# Patient Record
Sex: Female | Born: 1935 | Race: Black or African American | Hispanic: No
Health system: Southern US, Community
[De-identification: ages and names within clinical notes are randomized; demographics above are authoritative.]

---

## 2009-12-03 ENCOUNTER — Inpatient Hospital Stay: Payer: Self-pay | Admitting: Internal Medicine

## 2009-12-03 ENCOUNTER — Emergency Department: Payer: Self-pay | Admitting: Emergency Medicine

## 2011-01-12 ENCOUNTER — Inpatient Hospital Stay: Payer: Self-pay | Admitting: Internal Medicine

## 2012-09-15 ENCOUNTER — Emergency Department: Payer: Self-pay | Admitting: Emergency Medicine

## 2014-05-11 ENCOUNTER — Ambulatory Visit: Payer: Self-pay | Admitting: Internal Medicine

## 2014-06-04 ENCOUNTER — Inpatient Hospital Stay: Payer: Self-pay | Admitting: Internal Medicine

## 2014-06-04 LAB — URINALYSIS, COMPLETE
BACTERIA: NONE SEEN
BILIRUBIN, UR: NEGATIVE
BLOOD: NEGATIVE
Glucose,UR: NEGATIVE mg/dL (ref 0–75)
Ketone: NEGATIVE
LEUKOCYTE ESTERASE: NEGATIVE
Nitrite: NEGATIVE
Ph: 5 (ref 4.5–8.0)
Protein: NEGATIVE
RBC,UR: <1 /HPF (ref 0–5)
SQUAMOUS EPITHELIAL: NONE SEEN
Specific Gravity: 1.01 (ref 1.003–1.030)

## 2014-06-04 LAB — CBC
HCT: 30.3 % — ABNORMAL LOW (ref 40.0–52.0)
HGB: 9.4 g/dL — ABNORMAL LOW (ref 12.0–16.0)
MCH: 29.6 pg (ref 26.0–34.0)
MCHC: 31.2 g/dL — ABNORMAL LOW (ref 32.0–36.0)
MCV: 95 fL (ref 80–100)
Platelet: 155 10*3/uL (ref 150–440)
RBC: 3.19 10*6/uL — AB (ref 4.40–5.90)
RDW: 13.5 % (ref 11.5–14.5)
WBC: 7.1 10*3/uL (ref 3.8–10.6)

## 2014-06-04 LAB — COMPREHENSIVE METABOLIC PANEL
ALK PHOS: 75 U/L
ALT: 13 U/L — AB
Albumin: 3.2 g/dL — ABNORMAL LOW (ref 3.4–5.0)
Anion Gap: 8 (ref 7–16)
BUN: 33 mg/dL — ABNORMAL HIGH (ref 7–18)
Bilirubin,Total: 0.5 mg/dL (ref 0.2–1.0)
Calcium, Total: 9 mg/dL (ref 8.5–10.1)
Chloride: 113 mmol/L — ABNORMAL HIGH (ref 98–107)
Co2: 20 mmol/L — ABNORMAL LOW (ref 21–32)
Creatinine: 1.68 mg/dL — ABNORMAL HIGH (ref 0.60–1.30)
EGFR (Non-African Amer.): 25 — ABNORMAL LOW
GFR CALC AF AMER: 29 — AB
GLUCOSE: 167 mg/dL — AB (ref 65–99)
Osmolality: 292 (ref 275–301)
POTASSIUM: 6 mmol/L — AB (ref 3.5–5.1)
SGOT(AST): 29 U/L (ref 15–37)
Sodium: 141 mmol/L (ref 136–145)
TOTAL PROTEIN: 7.6 g/dL (ref 6.4–8.2)

## 2014-06-04 LAB — TROPONIN I: Troponin-I: <0.02 ng/mL

## 2014-06-04 LAB — LIPASE, BLOOD: Lipase: 430 U/L — ABNORMAL HIGH (ref 73–393)

## 2014-06-05 LAB — BASIC METABOLIC PANEL
Anion Gap: 6 — ABNORMAL LOW (ref 7–16)
BUN: 33 mg/dL — ABNORMAL HIGH (ref 7–18)
Calcium, Total: 8.7 mg/dL (ref 8.5–10.1)
Chloride: 115 mmol/L — ABNORMAL HIGH (ref 98–107)
Co2: 21 mmol/L (ref 21–32)
Creatinine: 1.7 mg/dL — ABNORMAL HIGH (ref 0.60–1.30)
GFR CALC AF AMER: 28 — AB
GFR CALC NON AF AMER: 24 — AB
Glucose: 200 mg/dL — ABNORMAL HIGH (ref 65–99)
OSMOLALITY: 296 (ref 275–301)
POTASSIUM: 6.3 mmol/L — AB (ref 3.5–5.1)
Sodium: 142 mmol/L (ref 136–145)

## 2014-06-05 LAB — CBC WITH DIFFERENTIAL/PLATELET
BASOS PCT: 0.1 %
Basophil #: 0 10*3/uL (ref 0.0–0.1)
EOS ABS: 0 10*3/uL (ref 0.0–0.7)
EOS PCT: 0 %
HCT: 31.1 % — AB (ref 40.0–52.0)
HGB: 9.6 g/dL — ABNORMAL LOW (ref 12.0–16.0)
LYMPHS ABS: 0.8 10*3/uL — AB (ref 1.0–3.6)
LYMPHS PCT: 6.8 %
MCH: 29.1 pg (ref 26.0–34.0)
MCHC: 31 g/dL — ABNORMAL LOW (ref 32.0–36.0)
MCV: 94 fL (ref 80–100)
MONO ABS: 0.5 10*3/uL (ref 0.2–1.0)
Monocyte %: 3.7 %
NEUTROS PCT: 89.4 %
Neutrophil #: 10.9 10*3/uL — ABNORMAL HIGH (ref 1.4–6.5)
PLATELETS: 125 10*3/uL — AB (ref 150–440)
RBC: 3.31 10*6/uL — ABNORMAL LOW (ref 4.40–5.90)
RDW: 13.1 % (ref 11.5–14.5)
WBC: 12.2 10*3/uL — ABNORMAL HIGH (ref 3.8–10.6)

## 2014-06-05 LAB — POTASSIUM
Potassium: 5.4 mmol/L — ABNORMAL HIGH (ref 3.5–5.1)
Potassium: 5.2 mmol/L — ABNORMAL HIGH (ref 3.5–5.1)

## 2014-06-07 ENCOUNTER — Inpatient Hospital Stay: Payer: Self-pay | Admitting: Internal Medicine

## 2014-06-07 LAB — COMPREHENSIVE METABOLIC PANEL
ALBUMIN: 2.8 g/dL — AB (ref 3.4–5.0)
ANION GAP: 7 (ref 7–16)
Alkaline Phosphatase: 65 U/L
BUN: 34 mg/dL — AB (ref 7–18)
Bilirubin,Total: 0.8 mg/dL (ref 0.2–1.0)
CALCIUM: 8.6 mg/dL (ref 8.5–10.1)
Chloride: 109 mmol/L — ABNORMAL HIGH (ref 98–107)
Co2: 26 mmol/L (ref 21–32)
Creatinine: 1.9 mg/dL — ABNORMAL HIGH (ref 0.60–1.30)
EGFR (African American): 25 — ABNORMAL LOW
GFR CALC NON AF AMER: 21 — AB
Glucose: 110 mg/dL — ABNORMAL HIGH (ref 65–99)
OSMOLALITY: 291 (ref 275–301)
POTASSIUM: 4.1 mmol/L (ref 3.5–5.1)
SGOT(AST): 19 U/L (ref 15–37)
SGPT (ALT): 9 U/L — ABNORMAL LOW
Sodium: 142 mmol/L (ref 136–145)
Total Protein: 7 g/dL (ref 6.4–8.2)

## 2014-06-07 LAB — CBC
HCT: 28.6 % — AB (ref 40.0–52.0)
HGB: 8.9 g/dL — ABNORMAL LOW (ref 12.0–16.0)
MCH: 29.1 pg (ref 26.0–34.0)
MCHC: 31.2 g/dL — ABNORMAL LOW (ref 32.0–36.0)
MCV: 93 fL (ref 80–100)
Platelet: 114 10*3/uL — ABNORMAL LOW (ref 150–440)
RBC: 3.06 10*6/uL — AB (ref 4.40–5.90)
RDW: 13 % (ref 11.5–14.5)
WBC: 13.4 10*3/uL — AB (ref 3.8–10.6)

## 2014-06-07 LAB — URINALYSIS, COMPLETE
BILIRUBIN, UR: NEGATIVE
BLOOD: NEGATIVE
Bacteria: NONE SEEN
Glucose,UR: NEGATIVE mg/dL (ref 0–75)
Hyaline Cast: 2
KETONE: NEGATIVE
Nitrite: NEGATIVE
PH: 5 (ref 4.5–8.0)
PROTEIN: NEGATIVE
RBC,UR: 1 /HPF (ref 0–5)
Specific Gravity: 1.011 (ref 1.003–1.030)
Squamous Epithelial: <1
WBC UR: 9 /HPF (ref 0–5)

## 2014-06-07 LAB — TROPONIN I: TROPONIN-I: 0.04 ng/mL

## 2014-06-07 LAB — LIPASE, BLOOD: Lipase: 128 U/L (ref 73–393)

## 2014-06-09 LAB — BASIC METABOLIC PANEL
ANION GAP: 8 (ref 7–16)
BUN: 34 mg/dL — AB (ref 7–18)
CALCIUM: 7.9 mg/dL — AB (ref 8.5–10.1)
Chloride: 114 mmol/L — ABNORMAL HIGH (ref 98–107)
Co2: 21 mmol/L (ref 21–32)
Creatinine: 1.36 mg/dL — ABNORMAL HIGH (ref 0.60–1.30)
EGFR (Non-African Amer.): 32 — ABNORMAL LOW
GFR CALC AF AMER: 37 — AB
Glucose: 86 mg/dL (ref 65–99)
OSMOLALITY: 292 (ref 275–301)
POTASSIUM: 4.7 mmol/L (ref 3.5–5.1)
SODIUM: 143 mmol/L (ref 136–145)

## 2014-06-11 ENCOUNTER — Ambulatory Visit: Payer: Self-pay | Admitting: Internal Medicine

## 2014-07-12 DEATH — deceased

## 2015-03-04 NOTE — H&P (Signed)
PATIENT NAME:  Shelly Warner, Shelly Warner MR#:  [id number] DATE OF BIRTH:  [birth year]  DATE OF ADMISSION:  06/07/2014  PRIMARY CARE PHYSICIAN: Dr. Enid Baasadhika Rosmary Dionisio, the patient's primary MD. The doctor is making house call.   CHIEF COMPLAINT: Abdominal pain.   HISTORY OF PRESENT ILLNESS: Shelly Warner is a 81 year old African American female with past medical history significant for dementia, hypertension, hyperlipidemia who presents from home secondary to abdominal pain. The patient was just discharged 2 days ago for abdominal pain and was noted to have constipation and hypokalemia at the time. Her constipation  relieved and the patient was discharged home with home health. According to family the patient was doing fine all day yesterday. This morning, 3:00 a.m. she woke up with severe abdominal pain not associated with any nausea or vomiting. She was brought here. The patient is not a great historian due to her dementia. CT of the abdomen and pelvis was done in  the ER showing small bowel dilatation concerning for possible partial distal small bowel obstruction and stool is noted in colon and rectum.   PAST MEDICAL HISTORY:  1. Hypertension.  2. Alzheimer dementia.  3. Hyperlipidemia.   ALLERGIES: To medications: None.   PAST SURGICAL HISTORY: None.   HOME MEDICATIONS:  1. Vitamin D 3000 international units 1 tablet p.o. daily.  2. Simvastatin 20 mg p.o. daily.  3. Norvasc 10 mg p.o. daily.   SOCIAL HISTORY: Lives at home. Daughter lives with her. Patient is bed bound and hard of hearing and legally blind due to macular degeneration. No smoking, alcohol or drug abuse.    FAMILY HISTORY: Significant for hypertension.   REVIEW OF SYSTEMS: Difficult to be obtained secondary to the patient's dementia.   PHYSICAL EXAMINATION: VITAL SIGNS:  Temperature 98.5 degrees Fahrenheit, pulse 67, respirations 20, blood pressure 143/59, pulse oximetry 97% on room air.  GENERAL: Elderly female well-built,  ill-nourished, lying in bed, not in any acute distress. HEENT: Normocephalic and atraumatic. Pupils equal, round, reacting to light. Extraocular movements intact. Oropharynx: Poor dentition and poor hygiene. but no erythema, mass or exudates.  NECK: Supple. No thyromegaly, JVD, or carotid bruits. The patient has significantly deep supraclavicular fossae  indicating malnutrition.  LUNGS: Moving air bilaterally. Decreased bibasilar breath sounds. No wheeze or crackles. No use of accessory muscles for breathing.  CARDIOVASCULAR: S1, S2, regular rate and rhythm. A 4/6 loud systolic murmur present. No rubs or gallops.  ABDOMEN: Soft, nontender, hypoactive bowel sounds present, nondistended.  EXTREMITIES: No pedal edema, atrophic extremities. No clubbing or cyanosis. There are 2+ dorsalis pedis pulses palpable bilaterally.  SKIN: No acne, rash or lesions.  LYMPHATICS: No cervical lymphadenopathy.  NEUROLOGIC: The patient is able to move around in bed. Weakness in bilateral lower extremities. No new focal motor or sensory deficits.  PSYCHOLOGICAL: The patient is alert and oriented to self.    LABORATORY DATA: WBC 13.4, hemoglobin 8.9, hematocrit 28.6 , platelet count 114,000. Sodium 142 , potassium 4.1, chloride 109, bicarbonate 26, BUN 34, creatinine 1.9, glucose 110 and calcium of 8.6. ALT 9, AST 19, alkaline phosphatase 65. Total bilirubin 0.8 and albumin of 3.8.    CT of the abdomen and pelvis showing mild dilated bilateral pleural effusions, subsegmental atelectasis. Nonobstructive and small bowel dilatation  indicating small bowel obstruction. Stool was noted in colon and rectum.  Troponin is negative at 0.04, lipase is 128.   EKG showing sinus rhythm with PVCs, heart rate of 70. Incomplete right bundle branch block. No acute ST-Warner  wave abnormalities.   ASSESSMENT AND PLAN:  1. A 81 year old female with dementia, hypertension, hyperlipidemia, recently discharged after constipation treatment, comes  back with abdominal pain, partial small bowel obstruction, constipation, and NG tube.  According to family, the patient will pull it out, so hold off on NG tube and keep her n.p.o. except medications, IV fluids. Repeat KUB in the a.m. Treatment of constipation recommended.  2. Acute on chronic renal failure, worsening creatinine, poor p.o. intake, decreased appetite. Continue IV fluids and recheck in a.m.  3. Hypertension, on Norvasc.  4. Hyperlipidemia, on statin.  5. Palliative care consult for goals of  treatment and care as well as failure to thrive. The patient does not have a power of attorney, multiple children. No code status addressing in the past.  6. Physical therapy and family interested in short-term rehabilitation. Social worker consult.   CODE STATUS: Full code for now.   TIME SPENT ON ADMISSION: 50 minutes.     ____________________________ Enid Baas, MD rk:jh D: 06/07/2014 15:06:06 ET Warner: 06/07/2014 16:51:11 ET JOB#: [id number]  cc: Enid Baas, MD, <Dictator> Enid Baas MD ELECTRONICALLY SIGNED 06/08/2014 15:14

## 2015-03-04 NOTE — Discharge Summary (Signed)
PATIENT NAME:  Shelly Warner, Shelly Warner MR#:  [id number] DATE OF BIRTH:  08/25/1912  DATE OF ADMISSION:  06/07/2014 DATE OF DISCHARGE:  06/10/2014  PRIMARY CARE PHYSICIAN: [facility]  FINAL DIAGNOSES: 1.  Partial bowel obstruction.  2.  Acute renal failure.  3.  Hypertension.  4.  Hyperlipidemia.  5.  Constipation.   DISCHARGE MEDICATIONS: Include simvastatin 20 mg daily, vitamin D 1000 international units daily, amlodipine 10 mg daily, Ensure clear 200 mL 3 times a day with meals, senna 1 tablet twice a day as needed for constipation, Colace 100 mg 1 capsule twice a day as needed for constipation.   DISCHARGE DIET: Regular, mechanical soft.   DISCHARGE ACTIVITY: As tolerated.   DISCHARGE FOLLOWUP: Follow up with hospice at the rehab facility. Follow up in 1 to 2 days with doctor at rehab.   REASON FOR ADMISSION: The patient was admitted June 07, 2014 and discharged June 10, 2014. The patient came in with abdominal pain, was found to have a partial small bowel obstruction. The patient was initially n.p.o. Anti-constipation medications were given.   DIAGNOSTIC DATA: During the hospital course included an EKG that showed sinus rhythm with premature supraventricular complexes.   Troponin negative. Lipase 128. White blood cell count 13.4, H and H 8.9 and 28.6, platelet count 114,000. Glucose 110, BUN 34, creatinine 1.9, sodium 142, potassium 4.1, chloride 109, CO2 26, calcium 8.6. Liver function tests normal range.   CT scan of the abdomen and pelvis showed bilateral nonobstructive nephrolithiasis. Again noted small bowel dilatation concerning for distal small bowel or partial obstruction. Stool noted in the colon and rectum.   Urinalysis: Trace leukocyte esterase and bacteria none.   Repeat KUB showed decreased small bowel distention since previous exam. Contrast with the CT scan located now in the colon.   Creatinine upon discharge 1.36.   HOSPITAL COURSE PER PROBLEM LIST:  1.   For the patient's partial small bowel obstruction, initially kept n.p.o. Constipation medications were given. The patient had bowel movements. Diet was slowly advanced from clear liquid to full liquid to solid food. The patient tolerating small amounts of food. No vomiting. No abdominal pain. Stable for discharge to rehab.  2.  Acute renal failure. This had improved with IV fluid hydration.  3.  Hypertension. Blood pressure borderline on Norvasc.  4.  Hyperlipidemia. On Zocor.  5.  Constipation. On senna and Colace.   DISPOSITION: The patient will be discharged to rehab facility under hospice care. The patient is a DO NOT RESUSCITATE.  TIME SPENT ON DISCHARGE: 35 minutes.   ____________________________ Herschell Dimesichard J. Renae GlossWieting, MD rjw:sb D: 06/10/2014 08:24:47 ET Warner: 06/10/2014 08:38:41 ET JOB#: [id number]  cc: Herschell Dimesichard J. Renae GlossWieting, MD, <Dictator> [facility] the patient is going to  Salley ScarletICHARD J Gae Bihl MD ELECTRONICALLY SIGNED 06/10/2014 14:24

## 2015-03-04 NOTE — Discharge Summary (Signed)
PATIENT NAME:  Shelly Warner, Rolla T MR#:  [id number] DATE OF BIRTH:  1912/02/19  DATE OF ADMISSION:  06/04/2014 DATE OF DISCHARGE:  06/05/2014  ADMISSION AND DISCHARGE DIAGNOSIS:  1.  Hyperkalemia.  2.  Kidney disease.  3.  CONSTIPATION 4.  Hyperkalemia. 5.  Likely chronic kidney disease.  6.  Hypertension.  7.  Constipation.   LABORATORY DATA AT DISCHARGE: Creatinine is 1.70, potassium 5.4.   HOSPITAL COURSE: A 81 year old female who presented on July 25 with abdominal pain and generalized weakness, found to have hyperkalemia and elevated creatinine. For further details please refer to the H and P.  1.  Hyperkalemia. The patient was noted to have elevated potassium and without any EKG changes. She was treated for hyperkalemia.  Her potassium has improved. I suspect this is related to her lisinopril with some kind of chronic kidney injury so her lisinopril obviously has been discontinued.  2.  Acute kidney injury.  Likely the patient as actually has chronic kidney disease stage III.  Her creatinine has remained pretty much stable.  I did discuss with the family to avoid any nephrotoxic agents and we are holding lisinopril.  3.  Constipation on KUB. 4.  Patient will be continue with p.r.n. oxygen.  5.  Dementia. The patient seems to be at her baseline, as per family.  6.  Hypertension. Her lisinopril was changed to Norvasc.   DISCHARGE MEDICATIONS:  1.  Simvastatin 20 mg daily.  2.  Vitamin D 3000 international units daily.  3.  Norvasc 10 mg daily.   Discharge with home health, physical therapy, nurse.   DISCHARGE DIET: Regular diet.   DISCHARGE with home Health DISCHARGE FOLLOWUP: The patient will follow up with doctor making house-calls in 1 week.   TIME SPENT: 35 minutes. The patient is stable for discharge.    ____________________________ Royetta Probus P. Juliene PinaMody, MD spm:lt D: 06/05/2014 12:03:59 ET T: 06/05/2014 15:39:25 ET JOB#: [id number]  cc: Dhiya Smits P. Juliene PinaMody, MD, <Dictator> Trenise Turay P  Jakyrah Holladay MD ELECTRONICALLY SIGNED 06/06/2014 13:00

## 2015-03-04 NOTE — H&P (Signed)
Shelly Warner [name] Warner, Shelly Warner Warner MR#:  [id number] DATE OF BIRTH:  [birth year]  DATE OF ADMISSION:  06/04/2014  PRIMARY CARE PHYSICIAN: Dr. Ignacia Felling.   REFERRING PHYSICIAN: Dr. Mindi Junker    CHIEF COMPLAINT: Abdominal pain, generalized weakness.   HISTORY OF PRESENT ILLNESS: Shelly Warner Shelly Warner Warner is a 81 year old female with history of hypertension, hyperlipidemia, diabetes mellitus, diet controlled, who comes to Shelly Warner Shelly Warner Warner with complaints of abdominal pain. Per family, Shelly Warner Shelly Warner Warner has been having abdominal pain since this morning with episodes of vomiting. Concerning this, Shelly Warner Shelly Warner Warner is brought to Shelly Warner Shelly Warner Warner. Work-up in Shelly Warner Shelly Warner Warner, Shelly Warner Shelly Warner Warner is found to have elevated BUN and creatinine of 33 and 1.68, with potassium of 6. Shelly Warner Shelly Warner Warner had a KUB done which showed nonobstructive bowel gas pattern, large stool burden. Shelly Warner Shelly Warner Warner is usually severely constipated. Last bowel movement was this morning. Shelly Warner Shelly Warner Warner was also found to have elevated blood Shelly Warner Warner, systolic in Shelly Warner 190s. Shelly Warner Shelly Warner Warner received one dose of hydralazine with improvement of Shelly Warner Shelly Warner Warner to Shelly Warner 170s.   PAST MEDICAL HISTORY: 1.  Hypertension.  2.  Hyperlipidemia.    ALLERGIES: NUTS.   HOME MEDICATIONS: 1.  Vitamin D3 1 capsule once a day.  2.  Simvastatin 20 mg once a day. 3.  Lisinopril 2.5 mg once a day.  4.  Ferrous sulfate 325 mg once a day.    SOCIAL HISTORY: No history of smoking, drinking alcohol or using illicit drugs. Lives at home. One of her Shelly Warner Warner stays with her. Shelly Warner Shelly Warner Warner, hard of hearing, legally blind.   PAST SURGICAL HISTORY: None.   FAMILY HISTORY: Hypertension.   REVIEW OF SYSTEMS: Shelly Warner Shelly Warner Warner is very hard of hearing. Difficult to obtain review of systems from Shelly Warner Shelly Warner Warner. History is mainly obtained from Shelly Warner Shelly Warner Warner. However, Shelly Warner Shelly Warner Warner currently denies any abdominal pain, nausea or chest pain.   PHYSICAL EXAMINATION: GENERAL: This  is a thin-built, cachectic-looking female, lying down in Shelly Warner Shelly Warner Warner, not in distress.  VITAL SIGNS: Temperature 97.8, pulse 58, blood Shelly Warner Warner 176/60, respiratory rate of 18, oxygen saturation is 96% on room air.  HEENT: Head normocephalic, atraumatic. There is no scleral icterus. Conjunctivae normal. Pupils equal and reactive to light. Mucous membranes dry.  NECK: Supple. No lymphadenopathy. No JVD. No carotid bruit.  CHEST: Has no focal tenderness.  LUNGS: Bilaterally clear to auscultation.  HEART: S1, S2 regular. No murmurs are heard.  ABDOMEN: Bowel sounds present. Soft, nontender, nondistended. Could not appreciate hepatosplenomegaly.  EXTREMITIES: No pedal edema. Pulses 2+.  SKIN: No rash or lesions.  MUSCULOSKELETAL: Could not examine.  NEUROLOGIC: Shelly Warner Shelly Warner Warner is alert, oriented to self and family members, not to place and time. No apparent cranial nerve abnormalities. Could not examine Shelly Warner motor and sensory, as Shelly Warner Shelly Warner Warner is very hard of hearing to follow commands.   LABORATORY DATA: Complete metabolic panel: BUN 33, creatinine of 1.68, potassium of 6, bicarbonate 20. Shelly Warner rest of all Shelly Warner values are within normal limits. KUB: Nonobstructive bowel gas pattern, constipation, large stool burden. Urinalysis for nitrites and leukocyte esterase. Troponin less than 0.2. Lipase 430.   ASSESSMENT AND PLAN: Shelly Warner Shelly Warner Warner is a 81 year old female who comes to Shelly Warner Shelly Warner Warner with complaints of abdominal pain.  1.  Abdominal pain. Shelly Warner Shelly Warner Warner has mildly elevated lipase, mild pancreatitis. There also is a concern from Shelly Warner constipation. Keep Shelly Warner Shelly Warner Warner on clear liquid diet. Give laxatives. Continue with intravenous fluids and follow up.  2.  Acute renal failure. Continue  with intravenous fluids and follow up. Hold lisinopril.  3.  Hyperkalemia. Could be from Shelly Warner renal failure as well as from Shelly Warner lisinopril. Hold Shelly Warner lisinopril. Shelly Warner Shelly Warner Warner is given one dose of Kayexalate. 4.  Dehydration. Continue  with intravenous fluids.  5.  Debility. Shelly Warner Shelly Warner Warner has been Shelly Warner Warner for a long time.  6.  Keep Shelly Warner Shelly Warner Warner on deep vein thrombosis prophylaxis with heparin.   TIME SPENT: 55 minutes.   ____________________________ Susa GriffinsPadmaja Brendia Dampier, MD pv:cg D: 06/04/2014 22:56:07 ET T: 06/04/2014 23:42:09 ET JOB#: [id number]  cc: Susa GriffinsPadmaja Cortina Vultaggio, MD, <Dictator> Susa GriffinsPADMAJA Shannen Flansburg MD ELECTRONICALLY SIGNED 06/16/2014 21:04

## 2015-07-21 IMAGING — CT CT ABD-PELV W/O CM
2 of 4 series · 17 of 46 positions shown, 19 images · non-contrast
Comparison: CT scan of December 06, 2009.

CLINICAL DATA: Abdominal pain.

EXAM:
CT ABDOMEN AND PELVIS WITHOUT CONTRAST
TECHNIQUE: Multidetector CT imaging of the abdomen and pelvis was performed
following the standard protocol without IV contrast.

[Series 4: routine abd pel without_ · axial · 0.62mm/px · z∈[-864,-500]mm · 14 of 81 slices shown, 16 images]
[im 4/81  soft-tissue]
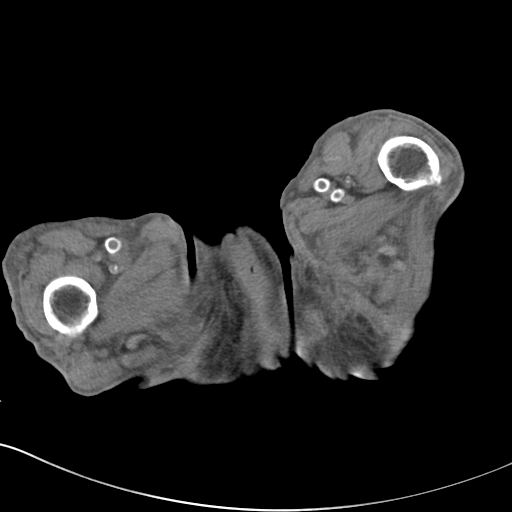
[im 4/81  bone]
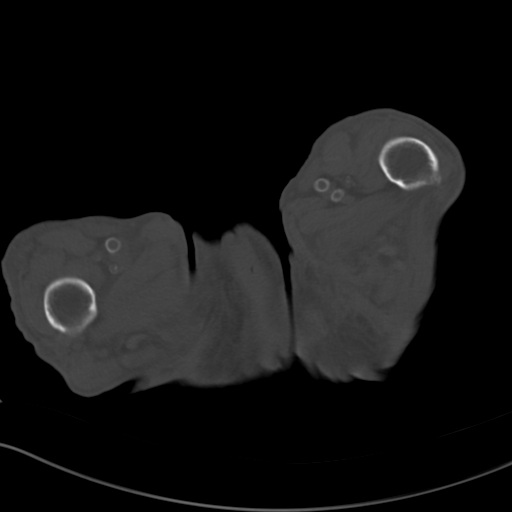
[im 10/81  soft-tissue]
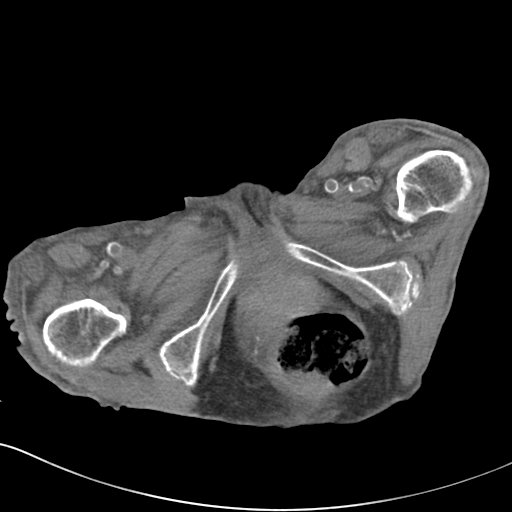
[im 17/81  soft-tissue]
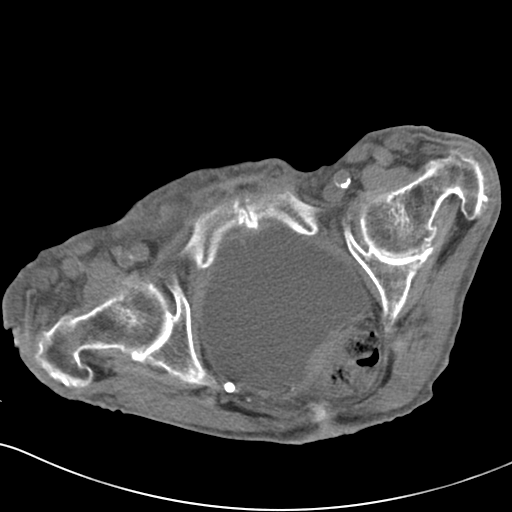
[im 23/81  soft-tissue]
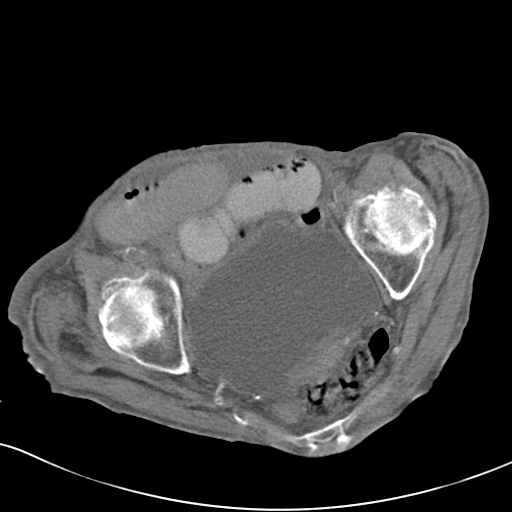
[im 26/81  soft-tissue]
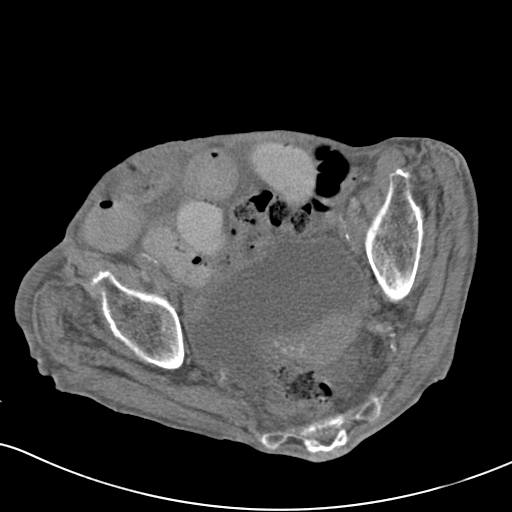
[im 33/81  soft-tissue]
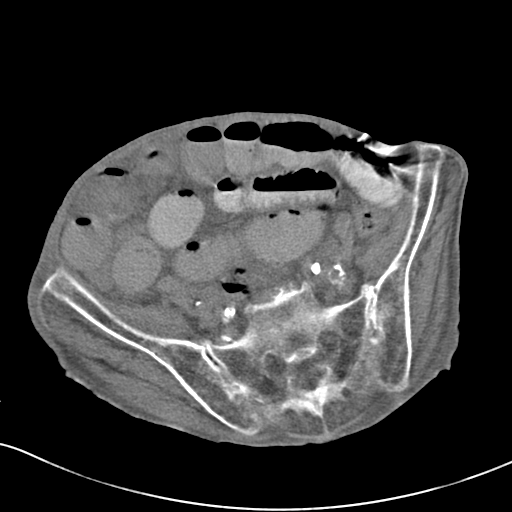
[im 39/81  soft-tissue]
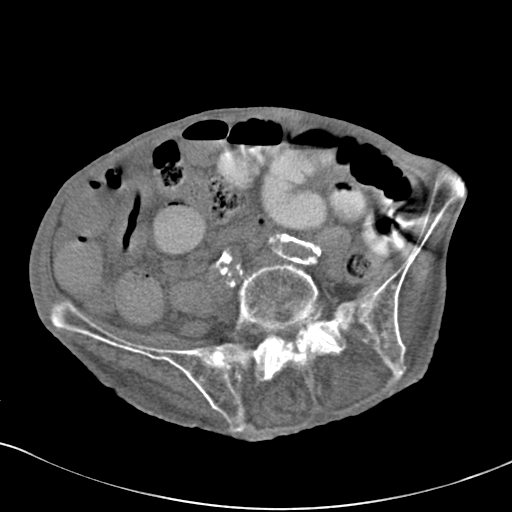
[im 42/81  soft-tissue]
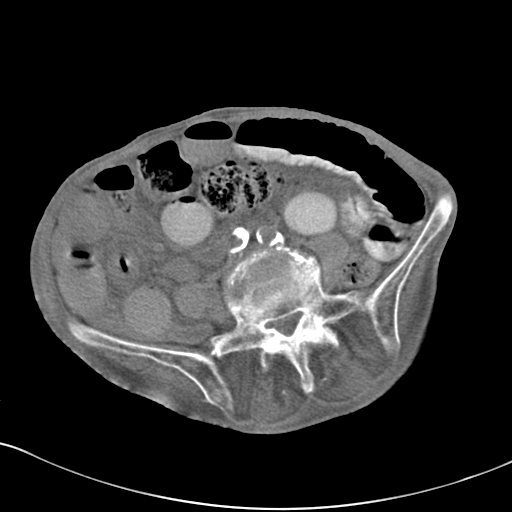
[im 49/81  soft-tissue]
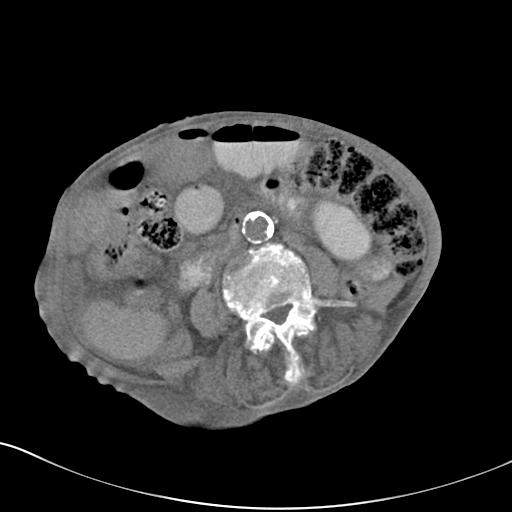
[im 49/81  bone]
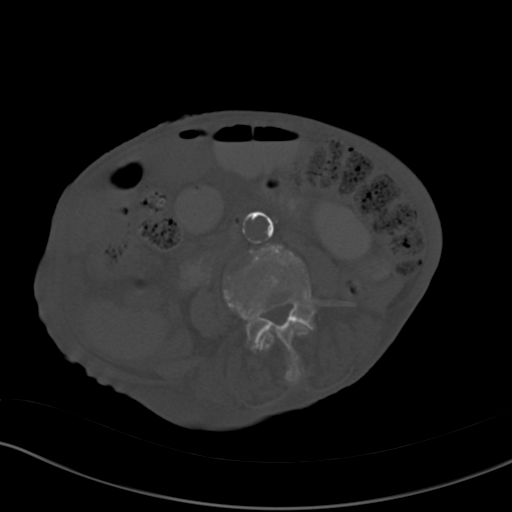
[im 55/81  soft-tissue]
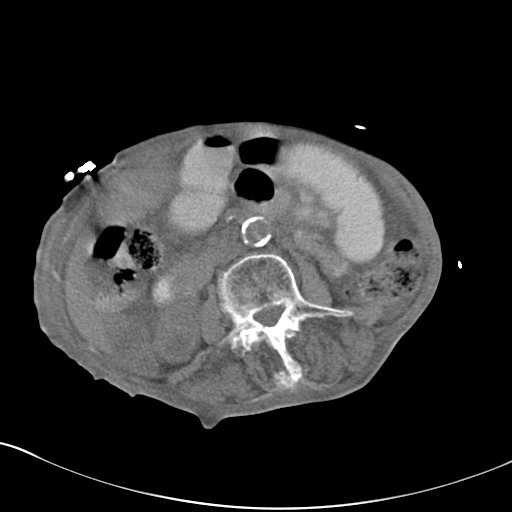
[im 61/81  soft-tissue]
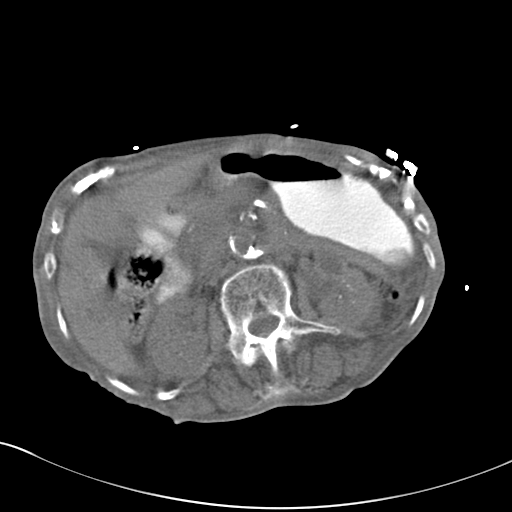
[im 65/81  soft-tissue]
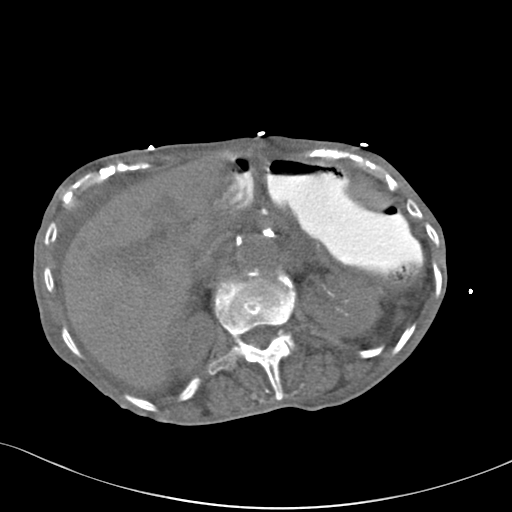
[im 71/81  soft-tissue]
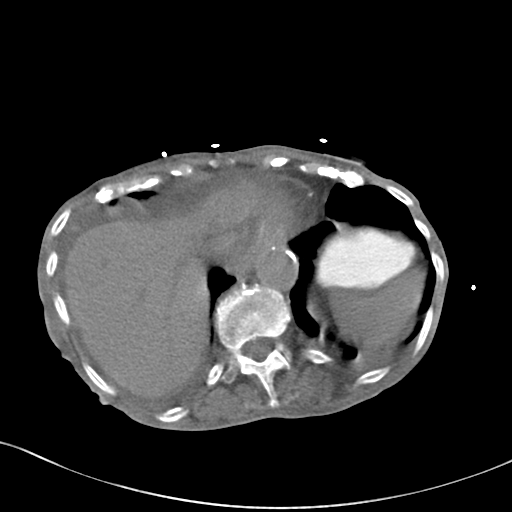
[im 77/81  soft-tissue]
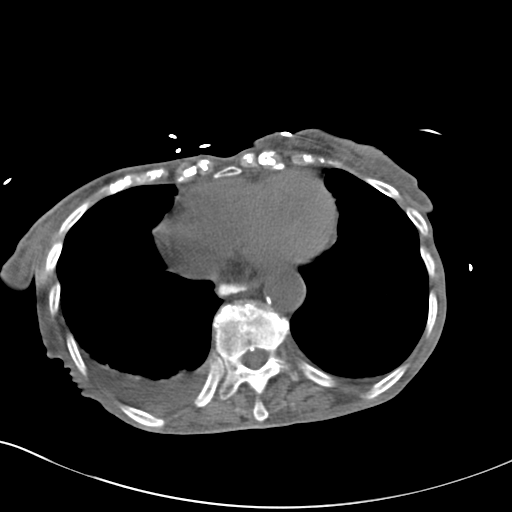

[Series 6: cor routine abd pel wo · coronal · 0.64mm/px · 3 of 114 slices shown]
[im 38/114  soft-tissue]
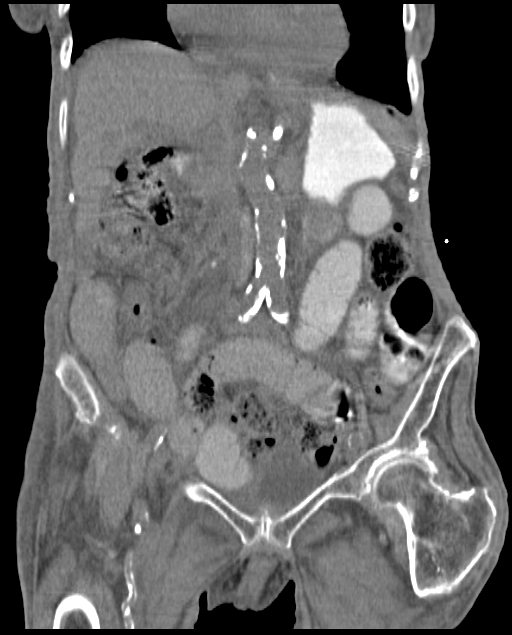
[im 51/114  soft-tissue]
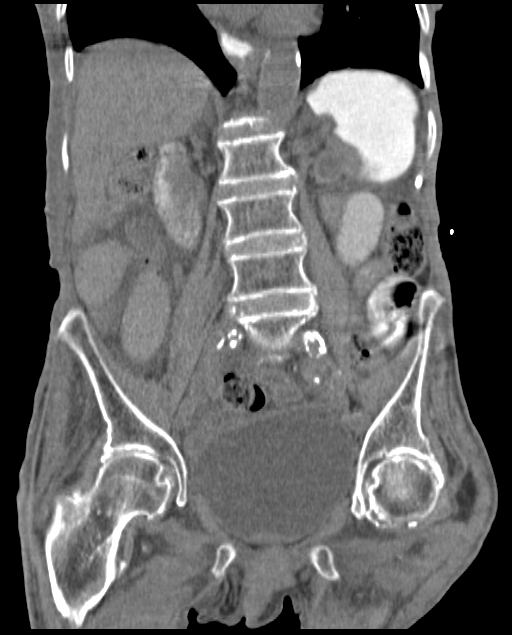
[im 63/114  soft-tissue]
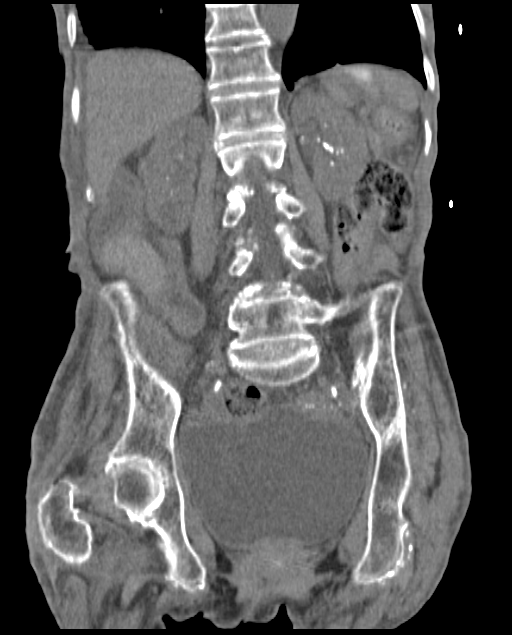

[17 of 46 positions shown; findings below may reference images not displayed]

FINDINGS: Mild bilateral pleural effusions are noted with adjacent
subsegmental atelectasis.

No gallstones are noted. No focal abnormality is noted in the liver,
spleen or pancreas on these unenhanced images. Atherosclerotic
calcifications of abdominal aorta are noted without aneurysm
formation. Bilateral nonobstructive nephrolithiasis is noted. 18 mm
cyst is seen arising from lower pole of left kidney which is
significantly smaller compared to prior exam. No hydronephrosis or
renal obstruction is noted. Stool is noted throughout the colon.
Small bowel dilatation is noted concerning for distal small bowel
obstruction. Urinary bladder distention is noted.
IMPRESSION: Bilateral nonobstructive nephrolithiasis is again noted.

Small bowel dilatation is noted concerning for distal small bowel
partial obstruction. Stool is noted in the colon and rectum.

## 2015-07-22 IMAGING — CR DG ABDOMEN 1V
1 series · 1 of 1 positions shown · non-contrast
Comparison: 06/04/2014; correlation CT in abdomen and pelvis
06/07/2014

CLINICAL DATA: Small bowel obstruction, followup

EXAM:
ABDOMEN - 1 VIEW

[x abdomen supine]
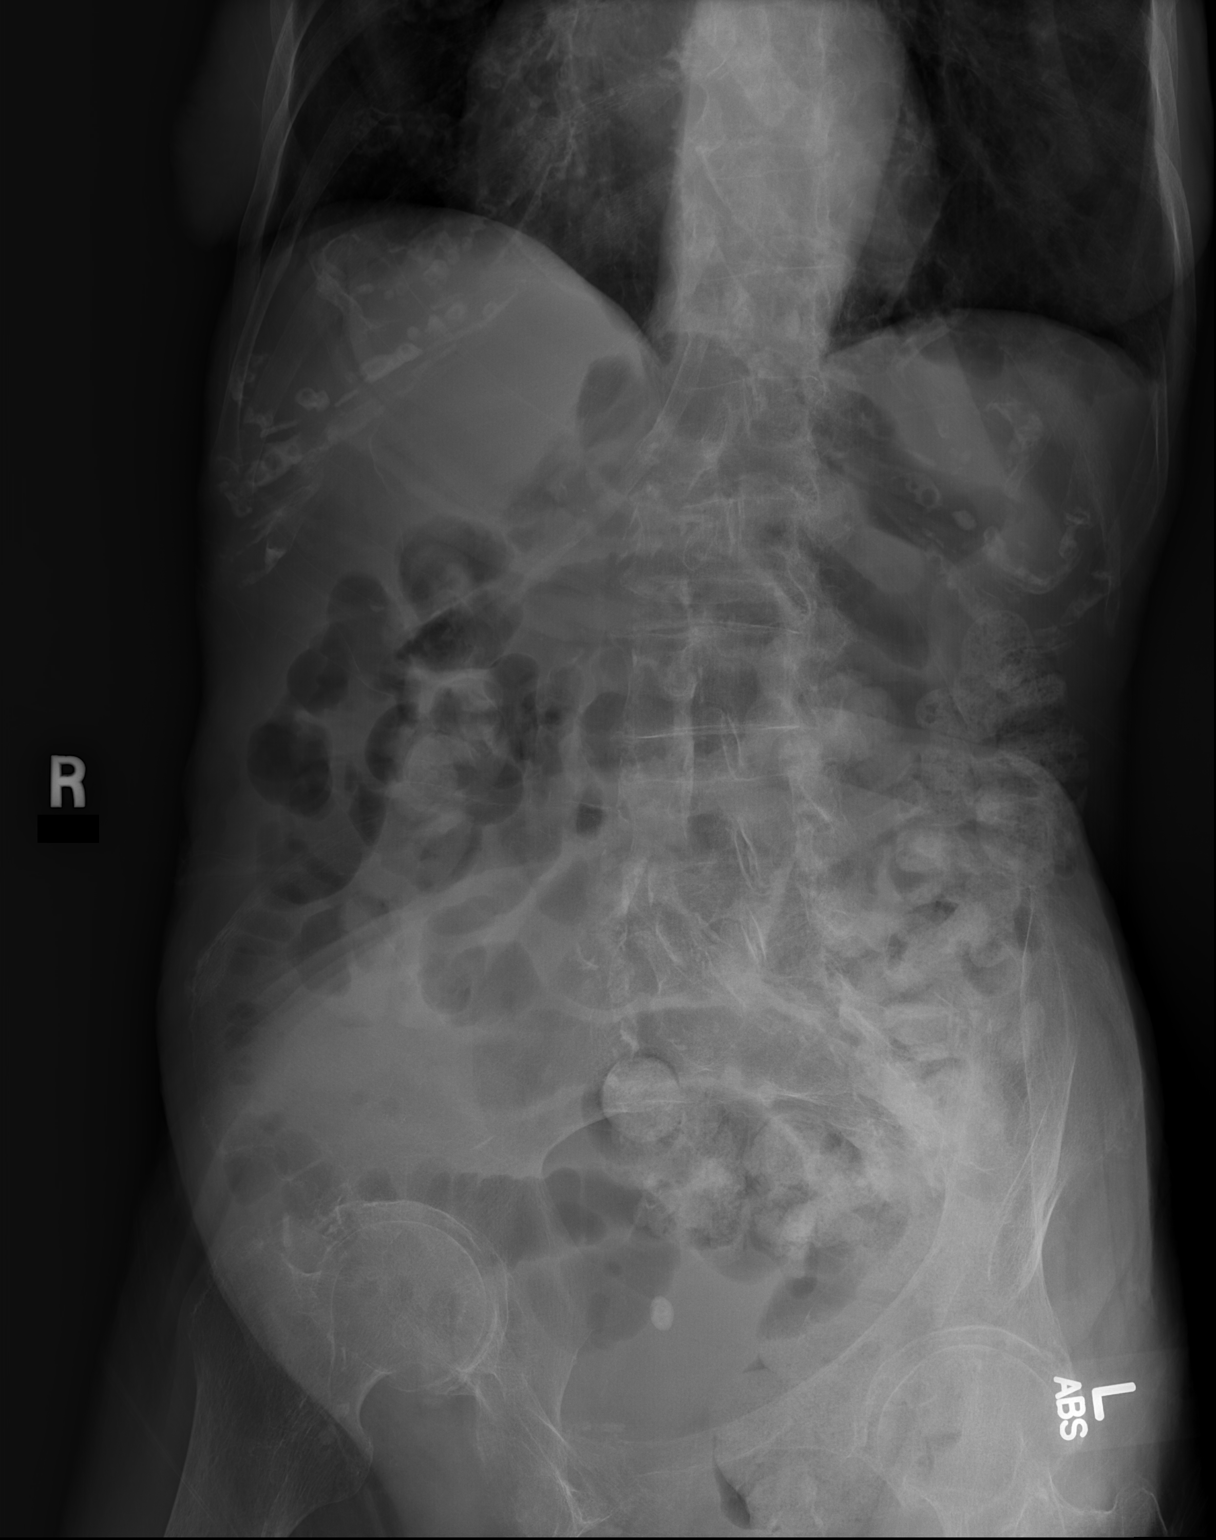

[1 of 1 positions shown; findings below may reference images not displayed]

FINDINGS: Scattered stool and residual contrast throughout colon.

Few loops of minimally prominent small bowel are seen in the pelvis,
decreased in prominence from prior CT.

No definite bowel wall thickening.

Calcified pelvic phleboliths.

Marked osseous demineralization.

Extensive atherosclerotic calcification.

Emphysematous changes at lung bases.
IMPRESSION: Decreased small bowel distention since previous exam.

Oral contrast administered for prior CT exam is now located within
the colon.
